# Patient Record
Sex: Female | Born: 1995 | Race: Black or African American | Hispanic: No | Marital: Single | Smoking: Never smoker
Health system: Southern US, Community
[De-identification: ages and names within clinical notes are randomized; demographics above are authoritative.]

## PROBLEM LIST (undated history)

## (undated) DIAGNOSIS — J189 Pneumonia, unspecified organism: Secondary | ICD-10-CM

## (undated) HISTORY — PX: SHOULDER SURGERY: SHX246

## (undated) HISTORY — PX: TONSILLECTOMY: SUR1361

## (undated) HISTORY — PX: APPENDECTOMY: SHX54

---
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
## 2013-02-25 ENCOUNTER — Emergency Department (HOSPITAL_COMMUNITY): Payer: Medicaid Other

## 2013-02-25 ENCOUNTER — Encounter (HOSPITAL_COMMUNITY): Payer: Self-pay | Admitting: Emergency Medicine

## 2013-02-25 DIAGNOSIS — R079 Chest pain, unspecified: Secondary | ICD-10-CM | POA: Insufficient documentation

## 2013-02-25 DIAGNOSIS — Z79899 Other long term (current) drug therapy: Secondary | ICD-10-CM | POA: Insufficient documentation

## 2013-02-25 DIAGNOSIS — Z3202 Encounter for pregnancy test, result negative: Secondary | ICD-10-CM | POA: Insufficient documentation

## 2013-02-25 DIAGNOSIS — J159 Unspecified bacterial pneumonia: Secondary | ICD-10-CM | POA: Insufficient documentation

## 2013-02-25 LAB — BASIC METABOLIC PANEL
BUN: 8 mg/dL (ref 6–23)
CHLORIDE: 101 meq/L (ref 96–112)
CO2: 26 mEq/L (ref 19–32)
Calcium: 9.3 mg/dL (ref 8.4–10.5)
Creatinine, Ser: 0.68 mg/dL (ref 0.50–1.10)
GFR calc non Af Amer: >90 mL/min (ref >90)
Glucose, Bld: 100 mg/dL — ABNORMAL HIGH (ref 70–99)
POTASSIUM: 4.4 meq/L (ref 3.7–5.3)
Sodium: 139 mEq/L (ref 137–147)

## 2013-02-25 LAB — CBC
HCT: 39.3 % (ref 36.0–46.0)
HEMOGLOBIN: 13.2 g/dL (ref 12.0–15.0)
MCH: 27.3 pg (ref 26.0–34.0)
MCHC: 33.6 g/dL (ref 30.0–36.0)
MCV: 81.2 fL (ref 78.0–100.0)
Platelets: 306 10*3/uL (ref 150–400)
RBC: 4.84 MIL/uL (ref 3.87–5.11)
RDW: 13.2 % (ref 11.5–15.5)
WBC: 14.1 10*3/uL — AB (ref 4.0–10.5)

## 2013-02-25 NOTE — ED Notes (Signed)
Presents with generalized chest pain with radiation to left neck and shoulder began yesterday and has worsened. Pain is described as " a boulder in my chest" associated with SOB with movement, pain with inspiration, and worsening pain with any movement. 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 [place]" pain is asssociated with nausea.

## 2013-02-26 ENCOUNTER — Emergency Department (HOSPITAL_COMMUNITY)
Admission: EM | Admit: 2013-02-26 | Discharge: 2013-02-26 | Disposition: A | Discharge status: Home / Self Care | Payer: Medicaid Other | Attending: Emergency Medicine | Admitting: Emergency Medicine

## 2013-02-26 ENCOUNTER — Emergency Department (HOSPITAL_COMMUNITY): Payer: Medicaid Other

## 2013-02-26 ENCOUNTER — Encounter (HOSPITAL_COMMUNITY): Payer: Self-pay | Admitting: Radiology

## 2013-02-26 DIAGNOSIS — R079 Chest pain, unspecified: Secondary | ICD-10-CM

## 2013-02-26 DIAGNOSIS — J189 Pneumonia, unspecified organism: Secondary | ICD-10-CM

## 2013-02-26 LAB — POC URINE PREG, ED: Preg Test, Ur: NEGATIVE

## 2013-02-26 MED ORDER — MORPHINE SULFATE 4 MG/ML IJ SOLN
4.0000 mg | Freq: Once | INTRAMUSCULAR | Status: AC
  Administered 2013-02-26: 4 mg via INTRAVENOUS
  Filled 2013-02-26: qty 1

## 2013-02-26 MED ORDER — IOHEXOL 350 MG/ML SOLN
100.0000 mL | Freq: Once | INTRAVENOUS | Status: AC | PRN
  Administered 2013-02-26: 100 mL via INTRAVENOUS

## 2013-02-26 MED ORDER — LEVOFLOXACIN IN D5W 750 MG/150ML IV SOLN
750.0000 mg | Freq: Once | INTRAVENOUS | Status: AC
  Administered 2013-02-26: 750 mg via INTRAVENOUS
  Filled 2013-02-26: qty 150

## 2013-02-26 MED ORDER — LEVOFLOXACIN 500 MG PO TABS: 750.0000 mg | ORAL_TABLET | Freq: Every day | ORAL | Status: DC

## 2013-02-26 MED ORDER — TRAMADOL HCL 50 MG PO TABS
50.0000 mg | ORAL_TABLET | Freq: Four times a day (QID) | ORAL | Status: DC | PRN
Start: 2013-02-26 — End: 2013-05-04

## 2013-02-26 MED ORDER — SODIUM CHLORIDE 0.9 % IV BOLUS (SEPSIS)
1000.0000 mL | Freq: Once | INTRAVENOUS | Status: AC
  Administered 2013-02-26: 1000 mL via INTRAVENOUS

## 2013-02-26 MED ORDER — OXYCODONE-ACETAMINOPHEN 5-325 MG PO TABS
2.0000 | ORAL_TABLET | Freq: Once | ORAL | Status: AC
  Administered 2013-02-26: 2 via ORAL
  Filled 2013-02-26: qty 2

## 2013-02-26 NOTE — Discharge Instructions (Signed)
YOUR CHEST XRAY SUGGESTS THAT YOU HAVE PNEUMONIA. YOU HAVE BEEN PRESCRIBED AN ANTIBIOTIC CALLED LEVAQUIN. TAKE THIS MEDICATION UNTIL COMPLETED.   PLEASE RETURN TO THE ED IF YOU DEVELOP SHORTNESS OF BREATH OR IF YOU HAVE ANY URGENT HEALTH CONCERNS OR WORSENING SYMPTOMS.

## 2013-02-26 NOTE — ED Provider Notes (Signed)
CSN: [id number]     Arrival date & time 02/25/13  2110 History   First MD Initiated Contact with Patient 02/26/13 0210     Chief Complaint  Patient presents with  . Chest Pain     (Consider location/radiation/quality/duration/timing/severity/associated sxs/prior Treatment) HPI This patient is a 18 yo woman with no PMH. She presents with approximately 24 hrs of diffuse pleuritic chest pain. Pain is worse with inspiration and with movements of the torso. Pt has not had cough. She is unaware of fever. She rates her pain 8/10. Denies history of similar sx. She notes that she has also had a couple of episodes of nausea followed by vomiting. No abdominal pain or diarrhea.    History reviewed. No pertinent past medical history. Past Surgical History  Procedure Laterality Date  . Shoulder surgery     History reviewed. No pertinent family history. History  Substance Use Topics  . Smoking status: Never Smoker   . Smokeless tobacco: Not on file  . Alcohol Use: No   OB History   Grav Para Term Preterm Abortions TAB SAB Ect Mult Living                 Review of Systems Ten point review of symptoms performed and is negative with the exception of symptoms noted above.   Allergies  Review of patient's allergies indicates no known allergies.  Home Medications   Current Outpatient Rx  Name  Route  Sig  Dispense  Refill  . norethindrone-ethinyl estradiol (BALZIVA) 0.4-35 MG-MCG tablet   Oral   Take 1 tablet by mouth daily.          BP 153/70  Pulse 108  Temp(Src) 98.2 F (36.8 C) (Oral)  Resp 18  Wt 226 lb 3 oz (102.598 kg)  SpO2 100%  LMP 02/19/2013 Physical Exam Gen: well developed and well nourished appearing Head: NCAT Eyes: PERL, EOMI Nose: no epistaixis or rhinorrhea Mouth/throat: mucosa is moist and pink Neck: supple, no stridor Lungs: CTA B, no wheezing, rhonchi or rales CV: rapid and regular, pulse 108 bpm, no murmur, extremities appear well perfused.  Chest  wall: no chest wall ttp Abd: soft, notender, nondistended Back: no ttp, no cva ttp Skin: warm and dry Ext: normal to inspection, no dependent edema Neuro: CN ii-xii grossly intact, no focal deficits Psyche; normal affect,  calm and cooperative.   ED Course  Procedures (including critical care time) Labs Review  Results for orders placed during the hospital encounter of 02/26/13 (from the past 24 hour(s))  BASIC METABOLIC PANEL     Status: Abnormal   Collection Time    02/25/13 10:05 PM      Result Value Ref Range   Sodium 139  137 - 147 mEq/L   Potassium 4.4  3.7 - 5.3 mEq/L   Chloride 101  96 - 112 mEq/L   CO2 26  19 - 32 mEq/L   Glucose, Bld 100 (*) 70 - 99 mg/dL   BUN 8  6 - 23 mg/dL   Creatinine, Ser 1.470.68  0.50 - 1.10 mg/dL   Calcium 9.3  8.4 - 82.910.5 mg/dL   GFR calc non Af Amer >90  >90 mL/min   GFR calc Af Amer >90  >90 mL/min  CBC     Status: Abnormal   Collection Time    02/25/13 10:05 PM      Result Value Ref Range   WBC 14.1 (*) 4.0 - 10.5 K/uL   RBC 4.84  3.87 - 5.11 MIL/uL   Hemoglobin 13.2  12.0 - 15.0 g/dL   HCT 16.1  09.6 - 04.5 %   MCV 81.2  78.0 - 100.0 fL   MCH 27.3  26.0 - 34.0 pg   MCHC 33.6  30.0 - 36.0 g/dL   RDW 40.9  81.1 - 91.4 %   Platelets 306  150 - 400 K/uL   Imaging Review Dg Chest 2 View  02/25/2013   CLINICAL DATA:  Chest and left-sided neck pain.  EXAM: CHEST  2 VIEW  COMPARISON:  None.  FINDINGS: The lungs are mildly hypoexpanded. Mild bibasilar airspace opacities likely reflect atelectasis. No pleural effusion or pneumothorax is seen.  The heart is normal in size; the mediastinal contour is within normal limits. No acute osseous abnormalities are seen.  IMPRESSION: Lungs mildly hypoexpanded; mild bibasilar airspace opacities likely reflect atelectasis.   Electronically Signed   By: Roanna Raider M.D.   On: 02/25/2013 22:42   EKG: sinus tach 107 bpm, no acute ischemic changes, normal intervals, normal axis, normal qrs complex   MDM    DDX: pneumonia v. Pericarditis v. Pleural effusion v. Chest wall pain v. PE.   CXR is concerning for pneumonia which is supported by WBC of 14K. However sx are concerning for PE. We will thus obtain a CTA chest to rule out PE. We will tx empirically with IV Levaquin and manage pain. If CTA negative for PE, I believe that the patient may safely be treated as an outpatient.   CTA chest negative for PE. Patient stable for d/c with plan for outpatient abx tx of early cap v. Bronchitis. Counseled re: return precautions and need for outpatient f/u.   Brandt Loosen, MD 02/26/13 [phone]

## 2013-05-04 ENCOUNTER — Emergency Department (HOSPITAL_COMMUNITY): Payer: Medicaid Other

## 2013-05-04 ENCOUNTER — Emergency Department (HOSPITAL_COMMUNITY)
Admission: EM | Admit: 2013-05-04 | Discharge: 2013-05-05 | Disposition: A | Discharge status: Home / Self Care | Payer: Medicaid Other | Attending: Emergency Medicine | Admitting: Emergency Medicine

## 2013-05-04 ENCOUNTER — Encounter (HOSPITAL_COMMUNITY): Payer: Self-pay | Admitting: Emergency Medicine

## 2013-05-04 DIAGNOSIS — R071 Chest pain on breathing: Secondary | ICD-10-CM | POA: Insufficient documentation

## 2013-05-04 DIAGNOSIS — R079 Chest pain, unspecified: Secondary | ICD-10-CM

## 2013-05-04 DIAGNOSIS — Z8701 Personal history of pneumonia (recurrent): Secondary | ICD-10-CM | POA: Insufficient documentation

## 2013-05-04 DIAGNOSIS — R0602 Shortness of breath: Secondary | ICD-10-CM | POA: Insufficient documentation

## 2013-05-04 HISTORY — DX: Pneumonia, unspecified organism: J18.9

## 2013-05-04 LAB — BASIC METABOLIC PANEL
BUN: 8 mg/dL (ref 6–23)
CHLORIDE: 103 meq/L (ref 96–112)
CO2: 21 meq/L (ref 19–32)
CREATININE: 0.73 mg/dL (ref 0.50–1.10)
Calcium: 8.9 mg/dL (ref 8.4–10.5)
GFR calc Af Amer: >90 mL/min (ref >90)
GFR calc non Af Amer: >90 mL/min (ref >90)
GLUCOSE: 98 mg/dL (ref 70–99)
Potassium: 4 mEq/L (ref 3.7–5.3)
Sodium: 140 mEq/L (ref 137–147)

## 2013-05-04 LAB — CBC
HEMATOCRIT: 37.4 % (ref 36.0–46.0)
HEMOGLOBIN: 12.5 g/dL (ref 12.0–15.0)
MCH: 27.1 pg (ref 26.0–34.0)
MCHC: 33.4 g/dL (ref 30.0–36.0)
MCV: 81.1 fL (ref 78.0–100.0)
PLATELETS: 280 10*3/uL (ref 150–400)
RBC: 4.61 MIL/uL (ref 3.87–5.11)
RDW: 13.8 % (ref 11.5–15.5)
WBC: 10.6 10*3/uL — AB (ref 4.0–10.5)

## 2013-05-04 LAB — PRO B NATRIURETIC PEPTIDE: Pro B Natriuretic peptide (BNP): 107.8 pg/mL (ref 0–125)

## 2013-05-04 LAB — D-DIMER, QUANTITATIVE (NOT AT ARMC): D DIMER QUANT: 0.76 ug{FEU}/mL — AB (ref 0.00–0.48)

## 2013-05-04 LAB — I-STAT TROPONIN, ED: TROPONIN I, POC: 0 ng/mL (ref 0.00–0.08)

## 2013-05-04 MED ORDER — ONDANSETRON HCL 4 MG/2ML IJ SOLN
4.0000 mg | Freq: Once | INTRAMUSCULAR | Status: AC
  Administered 2013-05-04: 4 mg via INTRAVENOUS
  Filled 2013-05-04: qty 2

## 2013-05-04 MED ORDER — OXYCODONE-ACETAMINOPHEN 5-325 MG PO TABS
1.0000 | ORAL_TABLET | Freq: Once | ORAL | Status: AC
  Administered 2013-05-04: 1 via ORAL
  Filled 2013-05-04: qty 1

## 2013-05-04 MED ORDER — HYDROMORPHONE HCL PF 1 MG/ML IJ SOLN
1.0000 mg | Freq: Once | INTRAMUSCULAR | Status: AC
  Administered 2013-05-04: 1 mg via INTRAVENOUS
  Filled 2013-05-04: qty 1

## 2013-05-04 MED ORDER — MORPHINE SULFATE 4 MG/ML IJ SOLN
4.0000 mg | Freq: Once | INTRAMUSCULAR | Status: AC
  Administered 2013-05-04: 4 mg via INTRAVENOUS
  Filled 2013-05-04: qty 1

## 2013-05-04 MED ORDER — IOHEXOL 350 MG/ML SOLN
100.0000 mL | Freq: Once | INTRAVENOUS | Status: AC | PRN
  Administered 2013-05-04: 100 mL via INTRAVENOUS

## 2013-05-04 MED ORDER — SODIUM CHLORIDE 0.9 % IV BOLUS (SEPSIS)
1000.0000 mL | Freq: Once | INTRAVENOUS | Status: AC
  Administered 2013-05-04: 1000 mL via INTRAVENOUS

## 2013-05-04 NOTE — ED Notes (Signed)
She said she was diagnosed with pneumonia about a month ago and "never followed up." states now shes having headache, sob and cp. She is a&Ox4, resp e/u. She was prescribed abx which she took as directed
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
## 2013-05-04 NOTE — ED Provider Notes (Signed)
9:27 PM Patient signed out to me by Jaynie Crumbleatyana Kirichenko, PA-C. I have [facility] she [facility] CT angio for her chest pain and SOB. Patient is shown to have left subclavian stenosis and no evidence of embolism. Patient does not need a repeat CT angio here and can be discharged with Vascular Surgery follow up.   10:04 PM Patient reports worsening chest pain and SOB since being in the ED. Patient takes birth control pills. I will order a d-dimer. Vitals stable and patient afebrile.  11:20 PM Patient's d-dimer is elevated. Patient will have CT angio to rule out PE. Patient will have dilaudid for pain since her pain is not controlled with morphine.   12:46 AM CT angio unremarkable for acute changes. Patient will be discharged with vascular surgery follow up.   Results for orders placed during the hospital encounter of 05/04/13  CBC      Result Value Ref Range   WBC 10.6 (*) 4.0 - 10.5 K/uL   RBC 4.61  3.87 - 5.11 MIL/uL   Hemoglobin 12.5  12.0 - 15.0 g/dL   HCT 16.137.4  09.636.0 - 04.546.0 %   MCV 81.1  78.0 - 100.0 fL   MCH 27.1  26.0 - 34.0 pg   MCHC 33.4  30.0 - 36.0 g/dL   RDW 40.913.8  81.111.5 - 91.415.5 %   Platelets 280  150 - 400 K/uL  BASIC METABOLIC PANEL      Result Value Ref Range   Sodium 140  137 - 147 mEq/L   Potassium 4.0  3.7 - 5.3 mEq/L   Chloride 103  96 - 112 mEq/L   CO2 21  19 - 32 mEq/L   Glucose, Bld 98  70 - 99 mg/dL   BUN 8  6 - 23 mg/dL   Creatinine, Ser 7.820.73  0.50 - 1.10 mg/dL   Calcium 8.9  8.4 - 95.610.5 mg/dL   GFR calc non Af Amer >90  >90 mL/min   GFR calc Af Amer >90  >90 mL/min  PRO B NATRIURETIC PEPTIDE      Result Value Ref Range   Pro B Natriuretic peptide (BNP) 107.8  0 - 125 pg/mL  D-DIMER, QUANTITATIVE      Result Value Ref Range   D-Dimer, Quant 0.76 (*) 0.00 - 0.48 ug/mL-FEU  I-STAT TROPOININ, ED      Result Value Ref Range   Troponin i, poc 0.00  0.00 - 0.08 ng/mL   Comment 3            Dg Chest 2 View  05/04/2013    CLINICAL DATA:  Pain.  EXAM: CHEST  2 VIEW  COMPARISON:  CT ANGIO CHEST W/CM &/OR WO/CM dated 02/26/2013  FINDINGS: Mediastinum and hilar structures are normal. Lungs are clear. Heart size normal. No pleural effusion or pneumothorax. No acute bony abnormality.  IMPRESSION: No active cardiopulmonary disease.   Electronically Signed   By: Maisie Fushomas  Register   On: 05/04/2013 18:56   Ct Angio Chest Pe W/cm &/or Wo Cm  05/05/2013   CLINICAL DATA:  Chest pain, elevated D-dimer, evaluate for PE  EXAM: CT ANGIOGRAPHY CHEST WITH CONTRAST  TECHNIQUE: Multidetector CT imaging of the chest was performed using the standard protocol during bolus administration of intravenous contrast. Multiplanar CT image reconstructions and MIPs were obtained to evaluate the vascular anatomy.  CONTRAST:  100mL OMNIPAQUE IOHEXOL 350 MG/ML SOLN  COMPARISON:  Chest radiographs dated 05/04/2013. CTA chest  dated 02/26/2013.  FINDINGS: No evidence of pulmonary embolism.  Lungs are clear. No pulmonary nodules. No pleural effusion or pneumothorax.  Visualized thyroid is unremarkable.  The heart is top-normal in size.  No pericardial effusion.  No suspicious mediastinal, hilar, or axillary lymphadenopathy.  Visualized upper abdomen is unremarkable.  Visualized osseous structures are within normal limits.  Review of the MIP images confirms the above findings.  IMPRESSION: No evidence of pulmonary embolism.  Normal CT chest.   Electronically Signed   By: Charline BillsSriyesh  Krishnan M.D.   On: 05/05/2013 00:09      Colleen BeckKaitlyn Faisal Stradling, PA-C 05/05/13 [phone]

## 2013-05-04 NOTE — ED Notes (Signed)
Room change and pain update given to CadeKaitlyn, GeorgiaPA.

## 2013-05-04 NOTE — ED Notes (Signed)
Onset 3 days constant mid to left chest pain.  When chest hurts head will hurt.  Pt c/o shortness of breath.  Breathing unlabored, pt talking in complete sentences.  No cough or other s/s noted.

## 2013-05-04 NOTE — ED Notes (Signed)
Report given to RatcliffPaul, RN, pt to move to C28.l

## 2013-05-05 MED ORDER — OXYCODONE-ACETAMINOPHEN 5-325 MG PO TABS: 2.0000 | ORAL_TABLET | ORAL | Status: DC | PRN

## 2013-05-05 NOTE — ED Provider Notes (Signed)
Medical screening examination/treatment/procedure(s) were performed by non-physician practitioner and as supervising physician I was immediately available for consultation/collaboration.   Celene KrasJon R Sarrah Fiorenza, MD 05/05/13 [phone]

## 2013-05-05 NOTE — Discharge Instructions (Signed)
Follow up with Dr. Darrick PennaFields regarding your left subclavian stenosis. Take Percocet as needed for pain. Refer to attached documents for more information.

## 2013-05-05 NOTE — ED Provider Notes (Signed)
History/physical exam/procedure(s) were performed by non-physician practitioner and as supervising physician I was immediately available for consultation/collaboration. I have reviewed all notes and am in agreement with care and plan.   Tate Jerkins S Nickolaus Bordelon, MD 05/05/13 2020 

## 2015-07-23 ENCOUNTER — Encounter (HOSPITAL_COMMUNITY): Payer: Self-pay | Admitting: Emergency Medicine

## 2015-07-23 DIAGNOSIS — M545 Low back pain: Secondary | ICD-10-CM | POA: Insufficient documentation

## 2015-07-23 DIAGNOSIS — Y929 Unspecified place or not applicable: Secondary | ICD-10-CM | POA: Insufficient documentation

## 2015-07-23 DIAGNOSIS — S0990XA Unspecified injury of head, initial encounter: Secondary | ICD-10-CM | POA: Insufficient documentation

## 2015-07-23 DIAGNOSIS — Y939 Activity, unspecified: Secondary | ICD-10-CM | POA: Insufficient documentation

## 2015-07-23 DIAGNOSIS — Y990 Civilian activity done for income or pay: Secondary | ICD-10-CM | POA: Insufficient documentation

## 2015-07-23 DIAGNOSIS — W010XXA Fall on same level from slipping, tripping and stumbling without subsequent striking against object, initial encounter: Secondary | ICD-10-CM | POA: Insufficient documentation

## 2015-07-23 DIAGNOSIS — M25561 Pain in right knee: Secondary | ICD-10-CM | POA: Insufficient documentation

## 2015-07-23 NOTE — ED Notes (Signed)
Pt. slipped and fell at work this evening , denies LOC /ambulatory , reports headache , low back pain , left hip and right knee pain .

## 2015-07-24 ENCOUNTER — Emergency Department (HOSPITAL_COMMUNITY)
Admission: EM | Admit: 2015-07-24 | Discharge: 2015-07-24 | Disposition: A | Discharge status: Home / Self Care | Payer: Self-pay | Attending: Emergency Medicine | Admitting: Emergency Medicine

## 2015-07-24 ENCOUNTER — Emergency Department (HOSPITAL_COMMUNITY): Payer: Self-pay

## 2015-07-24 DIAGNOSIS — W19XXXA Unspecified fall, initial encounter: Secondary | ICD-10-CM

## 2015-07-24 DIAGNOSIS — S0990XA Unspecified injury of head, initial encounter: Secondary | ICD-10-CM

## 2015-07-24 DIAGNOSIS — M545 Low back pain, unspecified: Secondary | ICD-10-CM

## 2015-07-24 DIAGNOSIS — M25561 Pain in right knee: Secondary | ICD-10-CM

## 2015-07-24 LAB — POC URINE PREG, ED: PREG TEST UR: NEGATIVE

## 2015-07-24 MED ORDER — ACETAMINOPHEN 500 MG PO TABS
1000.0000 mg | ORAL_TABLET | Freq: Once | ORAL | Status: AC
  Administered 2015-07-24: 1000 mg via ORAL
  Filled 2015-07-24: qty 2

## 2015-07-24 MED ORDER — ONDANSETRON 4 MG PO TBDP
8.0000 mg | ORAL_TABLET | Freq: Once | ORAL | Status: AC
  Administered 2015-07-24: 8 mg via ORAL
  Filled 2015-07-24: qty 2

## 2015-07-24 MED ORDER — KETOROLAC TROMETHAMINE 60 MG/2ML IM SOLN
60.0000 mg | Freq: Once | INTRAMUSCULAR | Status: AC
  Administered 2015-07-24: 60 mg via INTRAMUSCULAR
  Filled 2015-07-24: qty 2

## 2015-07-24 MED ORDER — ONDANSETRON 4 MG PO TBDP
4.0000 mg | ORAL_TABLET | Freq: Once | ORAL | Status: AC
  Administered 2015-07-24: 4 mg via ORAL
  Filled 2015-07-24: qty 1

## 2015-07-24 MED ORDER — NAPROXEN 500 MG PO TABS: 500.0000 mg | ORAL_TABLET | Freq: Two times a day (BID) | ORAL | Status: AC

## 2015-07-24 NOTE — ED Notes (Signed)
Pt requested knee brace.  This RN made PA N. Nadeau aware.  PA ordered knee sleeve.  PT refused knee sleeve, stated, "The sleeves just roll down because of my thighs,"  requested "something else."  PA made aware, stated knee immobilizer not appropriate.  Pt made aware, pt refused knee sleeve again, asked to speak with PA.  PA at bedside.

## 2015-07-24 NOTE — ED Provider Notes (Signed)
CSN: [id number]     Arrival date & time 07/23/15  2331 History   First MD Initiated Contact with Patient 07/24/15 0140     Chief Complaint  Patient presents with  . Fall   HPI  Colleen Lindsey is a 20 year old female presenting after a fall. Patient states she slipped in a wet spot at work and fell onto her left side. She states that her right knee twisted outwards. She also states that a heavy tray she was carrying fell onto her head. Denies loss of consciousness. She was able to eat off the floor without assistance and has been ambulatory since. She is complaining of severe right knee pain. The pain is exacerbated by suction and weightbearing. She has had to walk with a limp due to the pain. Endorses tingling below the knee. Denies weakness of the right lower extremity. She also complains of left-sided hip pain that radiates into the left lower back. The left hip pain is mild and does not cause pain with movement or weightbearing. Denies weakness or numbness of the left lower extremity. Her lumbar back pain is midline and bilateral. Denies saddle anesthesia or bowel/bladder incontinence. She states that she now has a severe headache over the left parietal scalp. She endorses blurred vision and nausea. She states that she feels that she is about to vomit but nothing is coming up. She has not taken any pain medications. Denies history of bleeding disorders. Denies neck pain, loss of vision, extremity weakness or confusion.   Past Medical History  Diagnosis Date  . Pneumonia    Past Surgical History  Procedure Laterality Date  . Shoulder surgery Right   . Tonsillectomy    . Appendectomy     No family history on file. Social History  Substance Use Topics  . Smoking status: Never Smoker   . Smokeless tobacco: None  . Alcohol Use: No   OB History    No data available     Review of Systems  All other systems reviewed and are negative.     Allergies  Review of patient's allergies indicates  no known allergies.  Home Medications   Prior to Admission medications   Medication Sig Start Date End Date Taking? Authorizing Provider  etonogestrel (NEXPLANON) 68 MG IMPL implant 1 each by Subdermal route once.   Yes Historical Provider, MD  medroxyPROGESTERone (PROVERA) 10 MG tablet Take 10 mg by mouth daily.   Yes Historical Provider, MD   BP 106/63 mmHg  Pulse 65  Temp(Src) 98.4 F (36.9 C) (Oral)  Resp 18  SpO2 100%  LMP 07/14/2015 Physical Exam  Constitutional: She appears well-developed and well-nourished. No distress.  HENT:  Head: Normocephalic and atraumatic.  Mouth/Throat: Oropharynx is clear and moist. No oropharyngeal exudate.  Tender over left parietal scalp. No hematoma, abrasion or laceration. No bony instability of the skull.  Eyes: Conjunctivae and EOM are normal. Pupils are equal, round, and reactive to light. Right eye exhibits no discharge. Left eye exhibits no discharge. No scleral icterus.  Neck: Normal range of motion. Neck supple. No rigidity.  No midline cervical spine tenderness. Full range motion of the neck intact.  Cardiovascular: Normal rate, regular rhythm and normal heart sounds.   Pulmonary/Chest: Effort normal and breath sounds normal. No respiratory distress.  Abdominal: Soft. There is no tenderness. There is no rebound and no guarding.  Musculoskeletal: Normal range of motion.  Tenderness to palpation over the medial, lateral and posterior knee. No swelling or deformity. Full  range of motion intact. No ligamentous laxity. No abnormal patellar movement.  No tenderness palpation over the left hip or pelvis. No pelvic instability. Full range motion of the left hip intact.  Generalized tenderness over the lumbar region with midline L-spine tenderness. No bony deformities or step-offs. Full range motion of the spine intact. No thoracic spine tenderness.  Neurological: She is alert. No cranial nerve deficit. She exhibits normal muscle tone.  Coordination normal.  Cranial nerves 3-12 tested and intact. 5/5 strength of all major muscle groups. Sensation to light touch intact throughout. Finger to nose coordinated. Walks with a steady gait unassisted.  Skin: Skin is warm and dry.  Psychiatric: She has a normal mood and affect. Her behavior is normal.  Nursing note and vitals reviewed.   ED Course  Procedures (including critical care time) Labs Review Labs Reviewed  POC URINE PREG, ED    Imaging Review Ct Head Wo Contrast  07/24/2015  CLINICAL DATA:  Pain after trauma EXAM: CT HEAD WITHOUT CONTRAST TECHNIQUE: Contiguous axial images were obtained from the base of the skull through the vertex without intravenous contrast. COMPARISON:  None. FINDINGS: Brain: No evidence of acute infarction, hemorrhage, extra-axial collection, ventriculomegaly, or mass effect. Vascular: No hyperdense vessel or unexpected calcification. Skull: Negative for fracture or focal lesion. Sinuses/Orbits: No acute findings. Other: None. IMPRESSION: Normal Electronically Signed   By: Gerome Samavid  Williams III M.D   On: 07/24/2015 02:56   I have personally reviewed and evaluated these images and lab results as part of my medical decision-making.   EKG Interpretation None      MDM   Final diagnoses:  Fall, initial encounter  Bilateral low back pain without sciatica  Head injuries, initial encounter  Right knee pain   20 year old female presenting after a fall with multiple complaints. Pain localized to the right knee, left hip, left lumbar back and head. No loss of consciousness. Hemodynamic stable. Nonfocal neuro exam. Head is atraumatic. Bilateral extremities neurovascularly intact with full range of motion. Tenderness to palpation of the right knee without ligamentous laxity or deformity. Patient indicates left hip pain but is pointing to her left-sided lumbar back. Generalized tenderness over the lumbar region. No bony deformities of the lumbar spine. Full  range of motion spine intact. No back pain or headache red flags. Discussed imaging knee and lumbar back with x-rays. Advised patient that I do not believe head CT is indicated at this time and discussed the risks and benefits. Patient states she will not feel comfortable until she has a head CT. Head CT negative. Patient care signed out to oncoming provider, Melburn HakeNicole Nadeau, PA-C, pending knee and back x-rays. Anticipate discharge home with symptomatic care and PCP follow-up.    Alveta HeimlichStevi Wilmetta Speiser, PA-C 07/24/15 0549  Layla MawKristen N Ward, DO 07/24/15 [phone]

## 2015-07-24 NOTE — ED Provider Notes (Signed)
Hand-off from Engelhard CorporationStevi Barrett, PA-C. Xrays pending.  Briefly patient is a 20 year old female who presents to the ED status post fall. Patient reports slipping on a wet spot at work resulting in her falling on her left side. Patient states she fell and twisted her right knee, she also reports the tray she was carrying hit her head. Denies LOC. Patient reports pain is aggravated with weightbearing. She also reports having midline lumbar back pain Denies weakness or numbness. Denies history of bleeding disorders. Denies use of anticoagulants.  On exam performed by initial provider, tenderness over left parietal scalp, no hematoma abrasion or laceration noted. No neuro deficits. Tenderness over medial, lateral and posterior right knee without swelling or deformity, full range of motion. Tenderness over left hip and pelvis, no pelvic instability, full range of motion of left hip. Tenderness over midline lumbar spine with no bony deformities or step-offs. Full range of motion of spine.  Pregnancy negative. CT head unremarkable. Right knee and lumbar spine x-ray pending. Screening x-rays are negative, plan to discharge patient home with symptomatic treatment and PCP follow-up. X-rays negative. On reevaluation patient reports she is still feeling nauseous, patient given second dose of antiemetics. VSS, HR 86. Exam revealed TTP of medial, lateral and posterior knee. FROM of back and hips. No neuro deficits or any other signs of head injury. Patient able to tolerate by mouth. Discussed results and plan for discharge with patient. Advised patient to follow up with her orthopedist as needed. Discussed return precautions with patient. Plan to discharge patient home with symptomatic treatment and RICE protocol.     Satira Sarkicole Elizabeth Purple SageNadeau, [place] JerseyPA-C 07/24/15 [id number]  Zadie Rhineonald Wickline, MD 07/24/15 2036

## 2015-07-24 NOTE — Discharge Instructions (Signed)
Taking her medications as prescribed as needed for pain relief. I also recommend resting, elevating and icing the right knee for 15-20 minutes 3-4 times daily. Follow-up with your orthopedist as needed if her symptoms have not improved over the next week. Return to emergency department if symptoms worsen or new onset of fever, redness, swelling, warmth, numbness, tingling, weakness, visual changes, dizziness, syncope, seizure.

## 2016-12-03 IMAGING — CT CT HEAD W/O CM
4 series · 17 of 47 positions shown, 19 images · non-contrast
Comparison: None.

CLINICAL DATA: Pain after trauma

EXAM:
CT HEAD WITHOUT CONTRAST
TECHNIQUE: Contiguous axial images were obtained from the base of the skull
through the vertex without intravenous contrast.

[Series 2: head without · axial · non-contrast · 0.44mm/px · z∈[-69,+51]mm · 7 of 33 slices shown, 9 images]
[im 5/33  brain]
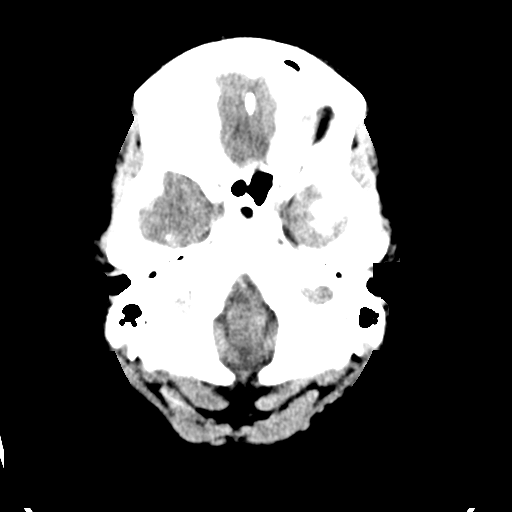
[im 5/33  bone]
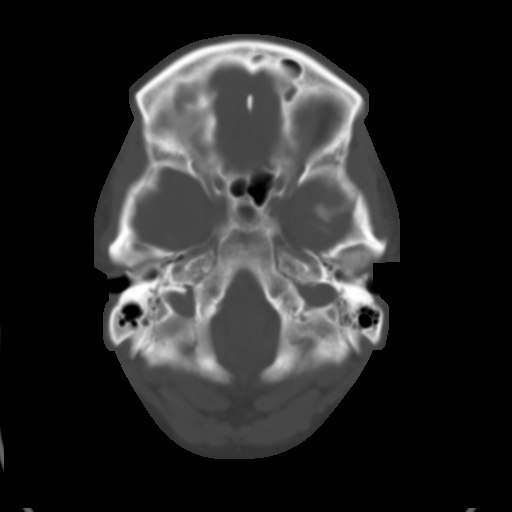
[im 9/33  brain]
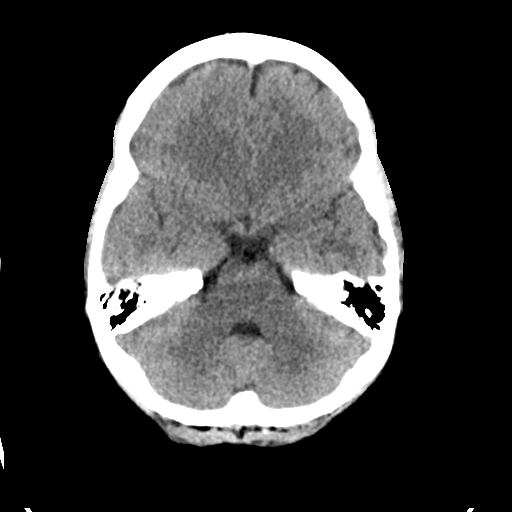
[im 13/33  brain]
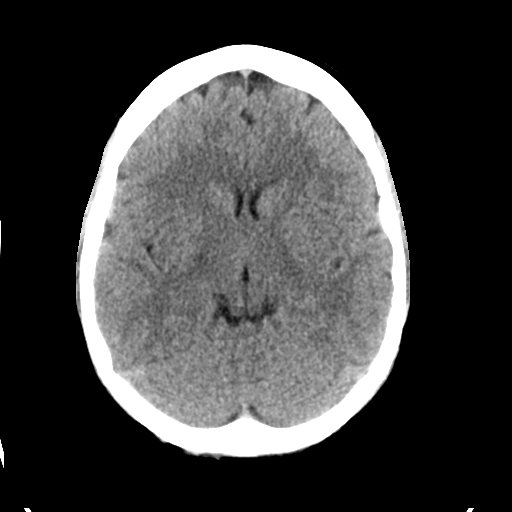
[im 17/33  brain]
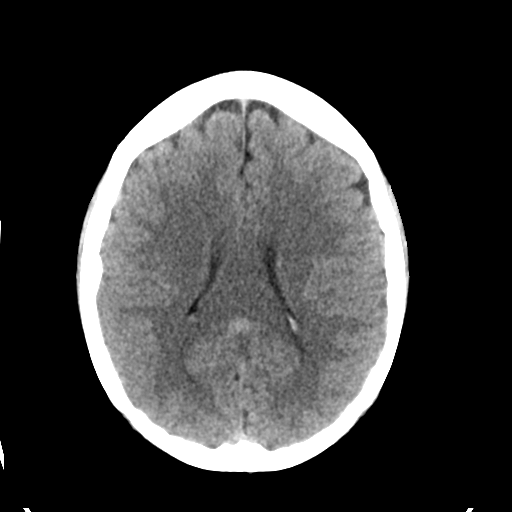
[im 21/33  brain]
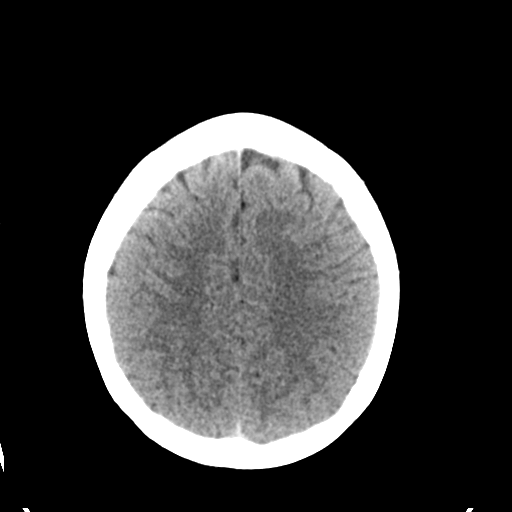
[im 21/33  bone]
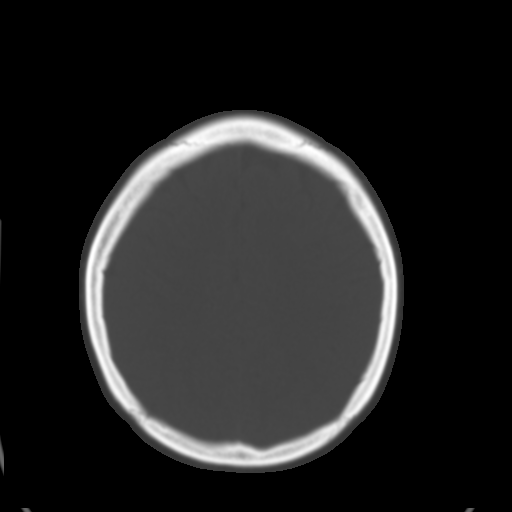
[im 25/33  brain]
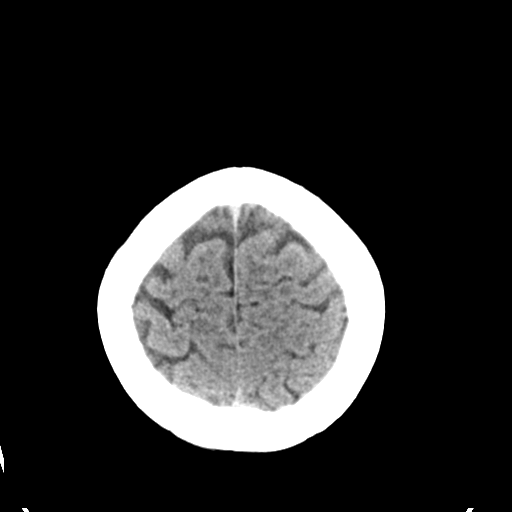
[im 29/33  brain]
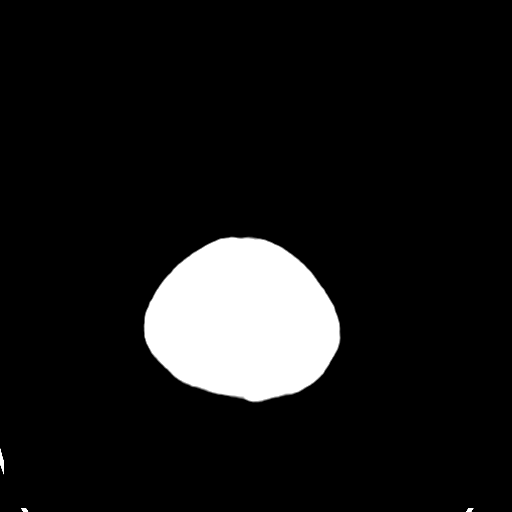

[Series 3: head bone · axial · 0.44mm/px · z∈[-73,-17]mm · 4 of 83 slices shown]
[im 9/83  bone]
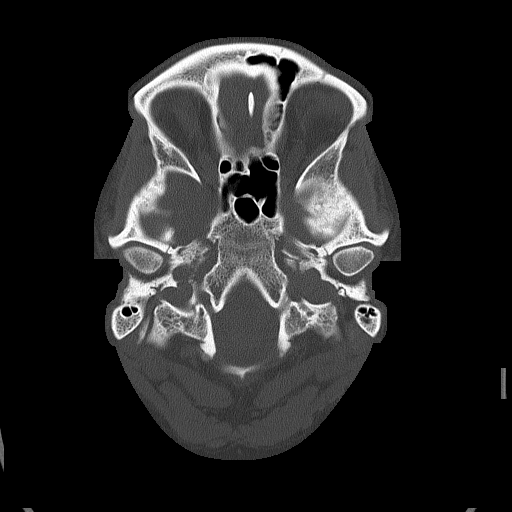
[im 17/83  bone]
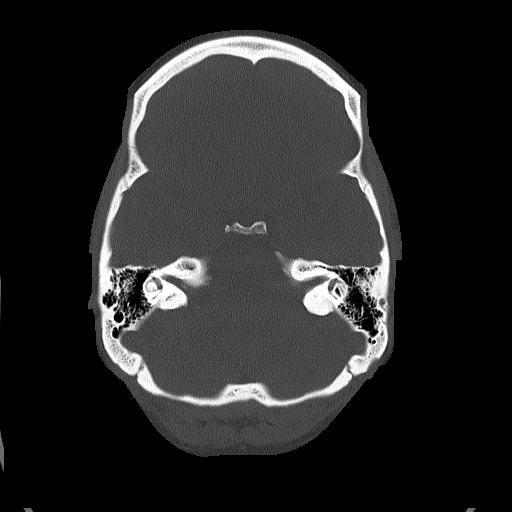
[im 25/83  bone]
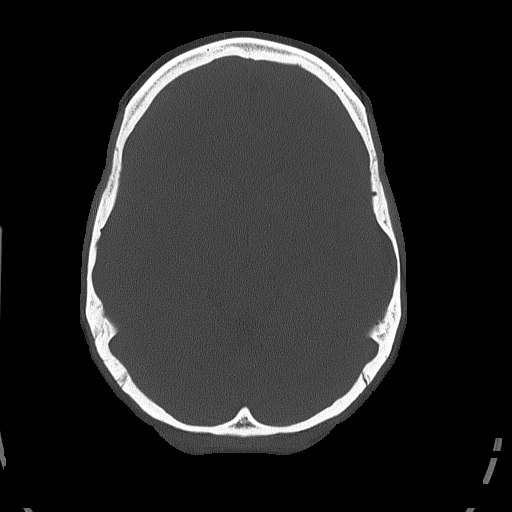
[im 37/83  bone]
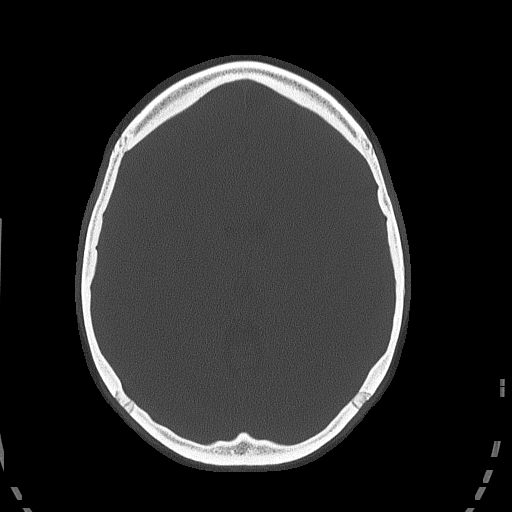

[Series 4: head without cor · coronal · non-contrast · 0.31mm/px · 3 of 68 slices shown]
[im 23/68  brain]
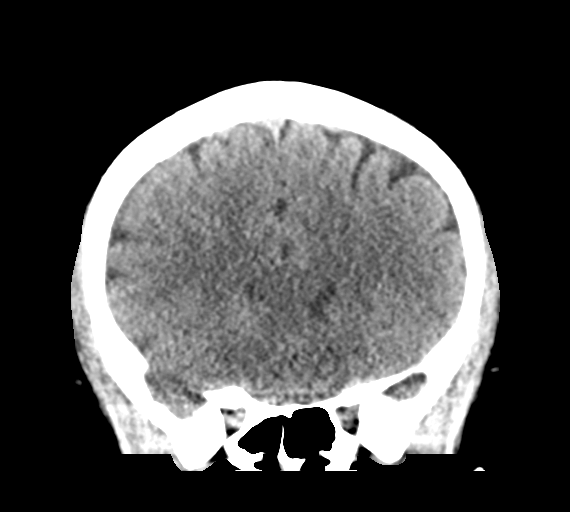
[im 30/68  brain]
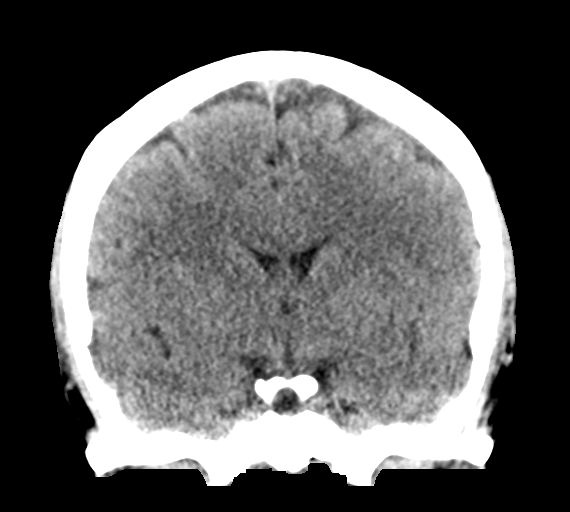
[im 38/68  brain]
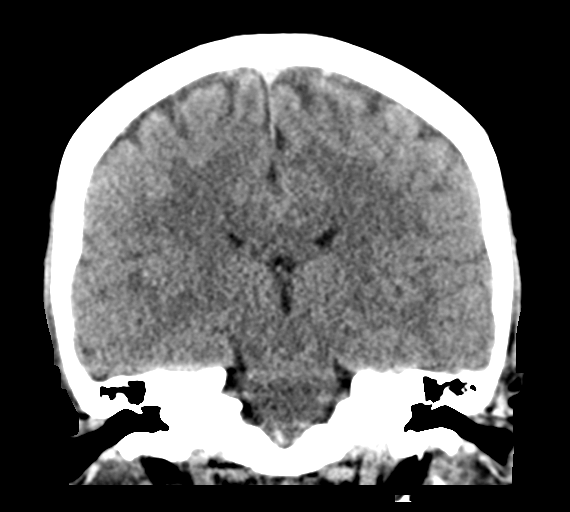

[Series 5: head without sag · sagittal · non-contrast · 0.35mm/px · 3 of 67 slices shown]
[im 23/67  brain]
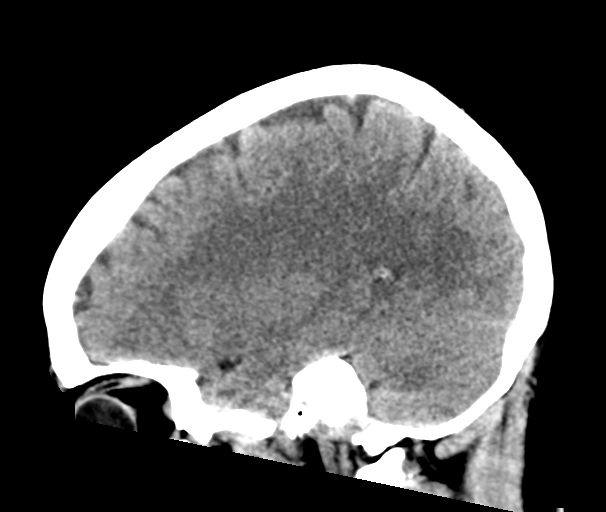
[im 34/67  brain]
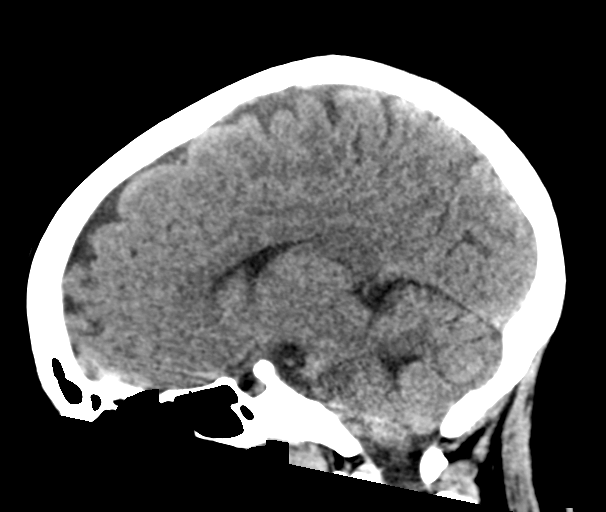
[im 45/67  brain]
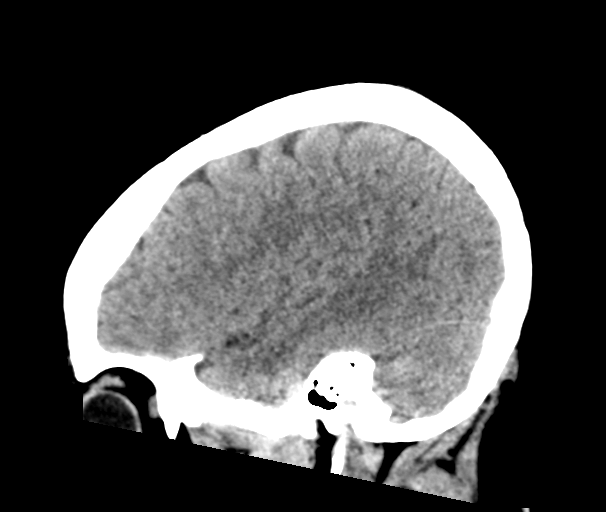

[17 of 47 positions shown; findings below may reference images not displayed]

FINDINGS: Brain: No evidence of acute infarction, hemorrhage, extra-axial
collection, ventriculomegaly, or mass effect.

Vascular: No hyperdense vessel or unexpected calcification.

Skull: Negative for fracture or focal lesion.

Sinuses/Orbits: No acute findings.

Other: None.
IMPRESSION: Normal
